# Patient Record
Sex: Female | Born: 1935 | Race: White | Hispanic: No
Health system: Southern US, Community
[De-identification: ages and names within clinical notes are randomized; demographics above are authoritative.]

---

## 2012-03-01 ENCOUNTER — Inpatient Hospital Stay: Payer: Self-pay | Admitting: Orthopedic Surgery

## 2012-03-01 LAB — CBC WITH DIFFERENTIAL/PLATELET
Basophil #: 0.1 10*3/uL (ref 0.0–0.1)
Eosinophil #: 0 10*3/uL (ref 0.0–0.7)
Eosinophil %: 0.1 %
HGB: 13.9 g/dL (ref 12.0–16.0)
Lymphocyte #: 1.1 10*3/uL (ref 1.0–3.6)
MCH: 29.7 pg (ref 26.0–34.0)
MCV: 87 fL (ref 80–100)
Monocyte %: 11.3 %
Neutrophil #: 9.8 10*3/uL — ABNORMAL HIGH (ref 1.4–6.5)
Neutrophil %: 79 %
RBC: 4.68 10*6/uL (ref 3.80–5.20)
WBC: 12.4 10*3/uL — ABNORMAL HIGH (ref 3.6–11.0)

## 2012-03-01 LAB — APTT: Activated PTT: 28.3 secs (ref 23.6–35.9)

## 2012-03-01 LAB — PROTIME-INR: Prothrombin Time: 13.3 secs (ref 11.5–14.7)

## 2012-03-02 LAB — URINALYSIS, COMPLETE
Bacteria: NONE SEEN
Bilirubin,UR: NEGATIVE
Glucose,UR: NEGATIVE mg/dL
Nitrite: NEGATIVE
Ph: 5
Protein: 100
RBC,UR: 4 /HPF
Specific Gravity: 1.031
Squamous Epithelial: 1
WBC UR: 2 /HPF

## 2012-03-02 LAB — BASIC METABOLIC PANEL
Calcium, Total: 8.4 mg/dL — ABNORMAL LOW (ref 8.5–10.1)
Chloride: 104 mmol/L (ref 98–107)
Creatinine: 0.77 mg/dL (ref 0.60–1.30)
EGFR (African American): >60
EGFR (Non-African Amer.): >60
Glucose: 111 mg/dL — ABNORMAL HIGH (ref 65–99)
Potassium: 3.4 mmol/L — ABNORMAL LOW (ref 3.5–5.1)
Sodium: 140 mmol/L (ref 136–145)

## 2012-03-02 LAB — CBC WITH DIFFERENTIAL/PLATELET
Basophil %: 0.1 %
Eosinophil #: 0 10*3/uL (ref 0.0–0.7)
Eosinophil %: 0.1 %
HCT: 39.7 % (ref 35.0–47.0)
Lymphocyte %: 7.1 %
MCH: 30.5 pg (ref 26.0–34.0)
MCHC: 34.6 g/dL (ref 32.0–36.0)
MCV: 88 fL (ref 80–100)
Monocyte %: 10.1 %
Neutrophil #: 10.2 10*3/uL — ABNORMAL HIGH (ref 1.4–6.5)
Neutrophil %: 82.6 %
RBC: 4.51 10*6/uL (ref 3.80–5.20)
RDW: 13.8 % (ref 11.5–14.5)
WBC: 12.4 10*3/uL — ABNORMAL HIGH (ref 3.6–11.0)

## 2012-03-02 LAB — COMPREHENSIVE METABOLIC PANEL WITH GFR
Albumin: 2.9 g/dL — ABNORMAL LOW
Alkaline Phosphatase: 95 U/L
Anion Gap: 11
BUN: 33 mg/dL — ABNORMAL HIGH
Bilirubin,Total: 0.7 mg/dL
Calcium, Total: 8.7 mg/dL
Chloride: 102 mmol/L
Co2: 25 mmol/L
Creatinine: 0.9 mg/dL
EGFR (African American): >60
EGFR (Non-African Amer.): 57 — ABNORMAL LOW
Glucose: 114 mg/dL — ABNORMAL HIGH
Osmolality: 284
Potassium: 3.7 mmol/L
SGOT(AST): 72 U/L — ABNORMAL HIGH
SGPT (ALT): 40 U/L
Sodium: 138 mmol/L
Total Protein: 6.6 g/dL

## 2012-03-02 LAB — CK TOTAL AND CKMB (NOT AT ARMC)
CK, Total: 1347 U/L — ABNORMAL HIGH
CK, Total: 684 U/L — ABNORMAL HIGH (ref 21–215)
CK, Total: 810 U/L — ABNORMAL HIGH (ref 21–215)
CK-MB: 6.2 ng/mL — ABNORMAL HIGH (ref 0.5–3.6)
CK-MB: 6.2 ng/mL — ABNORMAL HIGH (ref 0.5–3.6)
CK-MB: 9.8 ng/mL — ABNORMAL HIGH

## 2012-03-02 LAB — TROPONIN I
Troponin-I: 0.26 ng/mL — ABNORMAL HIGH
Troponin-I: 0.4 ng/mL — ABNORMAL HIGH
Troponin-I: 0.69 ng/mL — ABNORMAL HIGH

## 2012-03-02 LAB — TSH: Thyroid Stimulating Horm: 2.29 u[IU]/mL

## 2012-03-02 LAB — FOLATE: Folic Acid: 12.5 ng/mL (ref 3.1–100.0)

## 2012-03-03 LAB — BASIC METABOLIC PANEL
Calcium, Total: 8 mg/dL — ABNORMAL LOW (ref 8.5–10.1)
Chloride: 107 mmol/L (ref 98–107)
Co2: 23 mmol/L (ref 21–32)
Osmolality: 285 (ref 275–301)
Potassium: 3.5 mmol/L (ref 3.5–5.1)

## 2012-03-03 LAB — CBC WITH DIFFERENTIAL/PLATELET
Basophil #: 0 10*3/uL (ref 0.0–0.1)
Eosinophil %: 0.7 %
HCT: 34.4 % — ABNORMAL LOW (ref 35.0–47.0)
Lymphocyte %: 12.5 %
MCHC: 33.6 g/dL (ref 32.0–36.0)
MCV: 88 fL (ref 80–100)
Monocyte %: 12.1 %
Neutrophil #: 5.9 10*3/uL (ref 1.4–6.5)
RBC: 3.91 10*6/uL (ref 3.80–5.20)
RDW: 13.9 % (ref 11.5–14.5)
WBC: 7.9 10*3/uL (ref 3.6–11.0)

## 2012-03-03 LAB — CK: CK, Total: 747 U/L — ABNORMAL HIGH (ref 21–215)

## 2012-03-04 LAB — CBC WITH DIFFERENTIAL/PLATELET
Basophil #: 0 10*3/uL (ref 0.0–0.1)
Basophil %: 0.4 %
Eosinophil #: 0 10*3/uL (ref 0.0–0.7)
Eosinophil %: 0.1 %
HCT: 31.4 % — ABNORMAL LOW (ref 35.0–47.0)
HGB: 10.6 g/dL — ABNORMAL LOW (ref 12.0–16.0)
Lymphocyte #: 0.4 10*3/uL — ABNORMAL LOW (ref 1.0–3.6)
Lymphocyte %: 3.9 %
MCH: 29.5 pg (ref 26.0–34.0)
MCHC: 33.9 g/dL (ref 32.0–36.0)
Monocyte #: 1.3 x10 3/mm — ABNORMAL HIGH (ref 0.2–0.9)
Neutrophil #: 8 10*3/uL — ABNORMAL HIGH (ref 1.4–6.5)
Neutrophil %: 82.2 %
WBC: 9.7 10*3/uL (ref 3.6–11.0)

## 2012-03-04 LAB — BASIC METABOLIC PANEL
BUN: 16 mg/dL (ref 7–18)
Calcium, Total: 7.7 mg/dL — ABNORMAL LOW (ref 8.5–10.1)
Chloride: 104 mmol/L (ref 98–107)
Co2: 23 mmol/L (ref 21–32)
Creatinine: 0.54 mg/dL — ABNORMAL LOW (ref 0.60–1.30)
EGFR (African American): >60
Glucose: 115 mg/dL — ABNORMAL HIGH (ref 65–99)
Potassium: 3.3 mmol/L — ABNORMAL LOW (ref 3.5–5.1)

## 2012-03-05 LAB — CBC WITH DIFFERENTIAL/PLATELET
Basophil #: 0 10*3/uL (ref 0.0–0.1)
Basophil %: 0.2 %
Eosinophil #: 0 10*3/uL (ref 0.0–0.7)
Eosinophil %: 0.3 %
HCT: 29.4 % — ABNORMAL LOW (ref 35.0–47.0)
HGB: 10.1 g/dL — ABNORMAL LOW (ref 12.0–16.0)
Lymphocyte %: 9 %
MCHC: 34.2 g/dL (ref 32.0–36.0)
Neutrophil #: 8.9 10*3/uL — ABNORMAL HIGH (ref 1.4–6.5)
Neutrophil %: 76.3 %
RBC: 3.37 10*6/uL — ABNORMAL LOW (ref 3.80–5.20)
RDW: 13.5 % (ref 11.5–14.5)
WBC: 11.7 10*3/uL — ABNORMAL HIGH (ref 3.6–11.0)

## 2012-03-05 LAB — URINALYSIS, COMPLETE
Bacteria: NONE SEEN
Bilirubin,UR: NEGATIVE
Glucose,UR: NEGATIVE mg/dL (ref 0–75)
Ketone: NEGATIVE
Ph: 5 (ref 4.5–8.0)
Protein: 100
RBC,UR: 76 /HPF (ref 0–5)
Squamous Epithelial: 1
WBC UR: 71 /HPF (ref 0–5)

## 2012-03-05 LAB — BASIC METABOLIC PANEL
Anion Gap: 9 (ref 7–16)
BUN: 24 mg/dL — ABNORMAL HIGH (ref 7–18)
Calcium, Total: 8 mg/dL — ABNORMAL LOW (ref 8.5–10.1)
Chloride: 106 mmol/L (ref 98–107)
Osmolality: 289 (ref 275–301)
Potassium: 4.2 mmol/L (ref 3.5–5.1)

## 2012-03-05 LAB — MAGNESIUM: Magnesium: 2.1 mg/dL

## 2012-03-06 LAB — CBC WITH DIFFERENTIAL/PLATELET
Basophil #: 0 10*3/uL (ref 0.0–0.1)
Basophil %: 0.3 %
Eosinophil #: 0.1 10*3/uL (ref 0.0–0.7)
Lymphocyte #: 0.8 10*3/uL — ABNORMAL LOW (ref 1.0–3.6)
MCV: 87 fL (ref 80–100)
Monocyte #: 1.2 x10 3/mm — ABNORMAL HIGH (ref 0.2–0.9)
Monocyte %: 10.2 %
Platelet: 319 10*3/uL (ref 150–440)
RDW: 13.8 % (ref 11.5–14.5)
WBC: 12.1 10*3/uL — ABNORMAL HIGH (ref 3.6–11.0)

## 2012-03-06 LAB — BASIC METABOLIC PANEL
Anion Gap: 7 (ref 7–16)
BUN: 20 mg/dL — ABNORMAL HIGH (ref 7–18)
Calcium, Total: 7.8 mg/dL — ABNORMAL LOW (ref 8.5–10.1)
Chloride: 107 mmol/L (ref 98–107)
Creatinine: 0.61 mg/dL (ref 0.60–1.30)
EGFR (Non-African Amer.): >60
Potassium: 3.9 mmol/L (ref 3.5–5.1)

## 2012-03-06 LAB — PATHOLOGY REPORT

## 2012-03-06 LAB — URINE CULTURE

## 2012-03-10 LAB — CULTURE, BLOOD (SINGLE)

## 2012-07-04 DEATH — deceased

## 2014-01-31 IMAGING — CR DG CHEST 1V PORT
1 series · 1 of 1 positions shown · non-contrast
Comparison: none

REASON FOR EXAM: Fever
COMMENTS:

[ap]
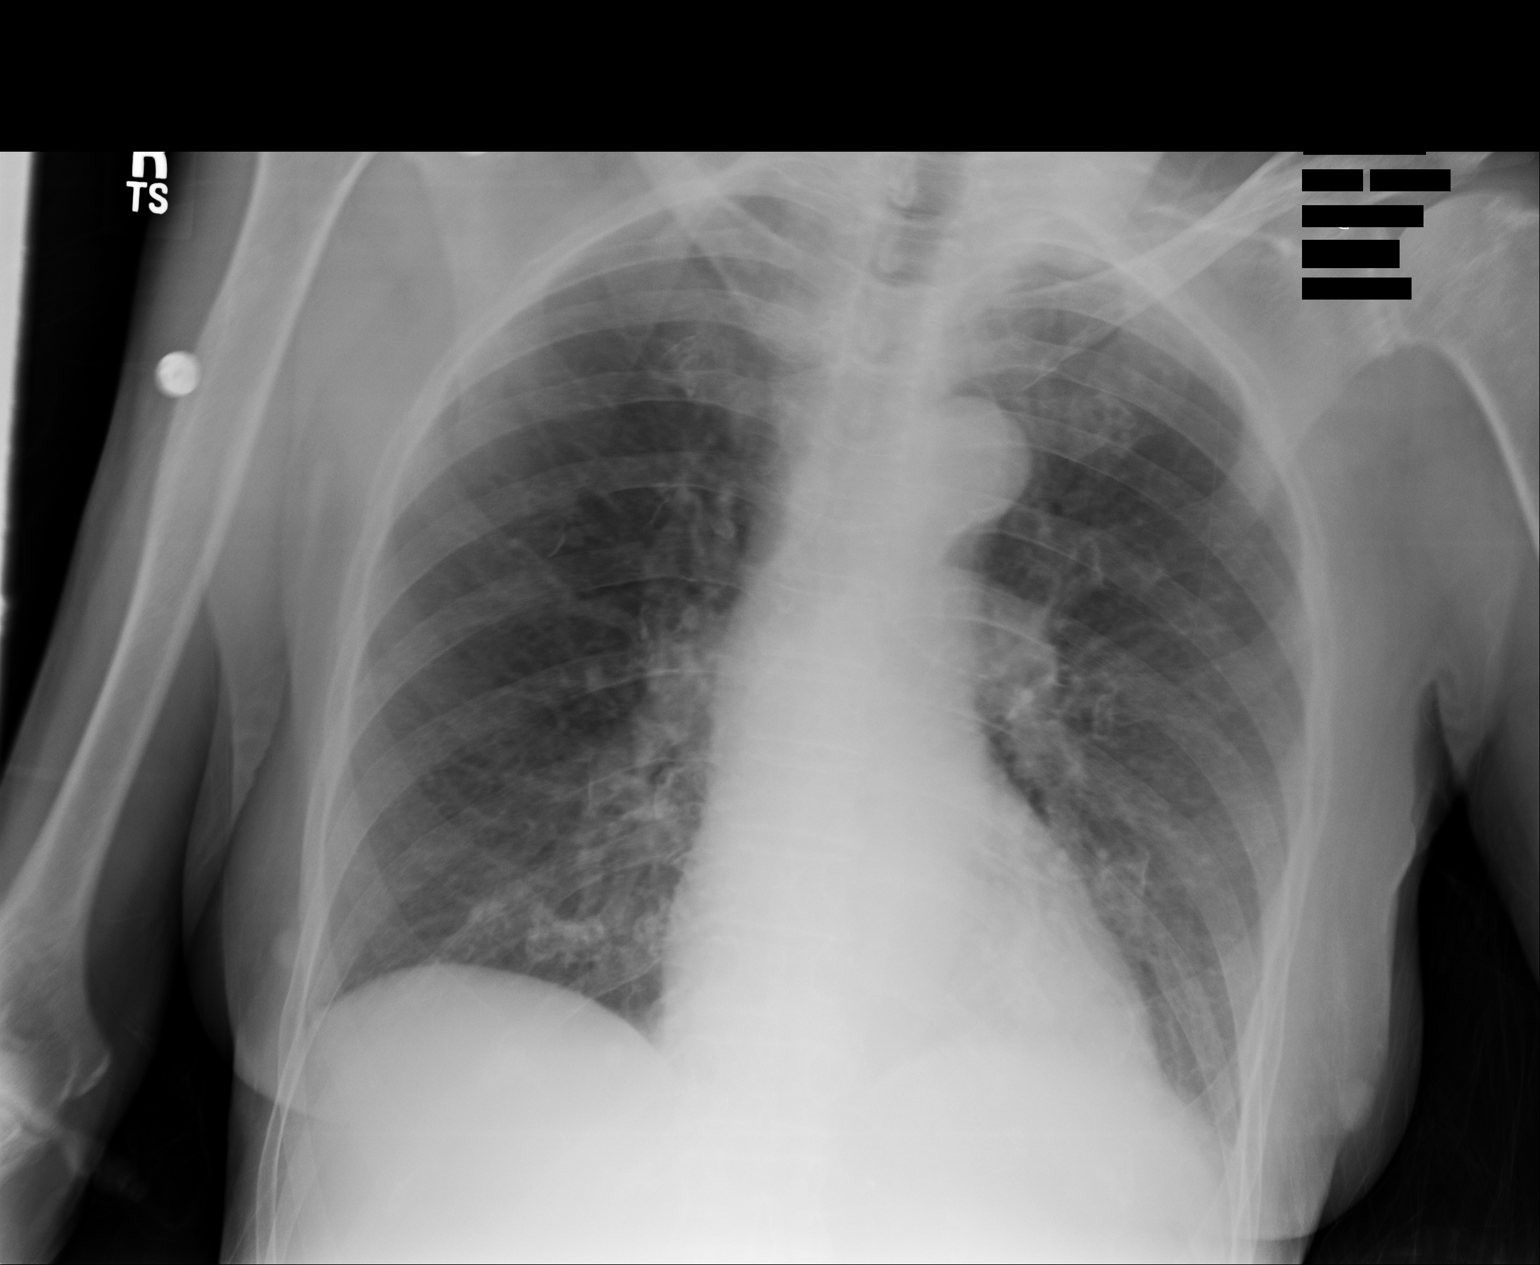

[1 of 1 positions shown; findings below may reference images not displayed]

PROCEDURE:     DXR - DXR PORTABLE CHEST SINGLE VIEW  - March 05, 2012  [DATE]

RESULT:     Comparison is made to the study March 01, 2012.

The lungs are well-expanded. There is no focal infiltrate. The cardiac
silhouette is normal in size. The mediastinum is normal in width. There is
no pleural effusion. The bony thorax exhibits no acute abnormality. There
are coarse lung markings in the retrocardiac region on the left which are
not significantly changed since the previous study.
IMPRESSION: There is no definite evidence of pneumonia nor other acute
cardiopulmonary abnormality.

[REDACTED]

## 2014-09-20 NOTE — Discharge Summary (Signed)
PATIENT NAME:  Michelle Reyes, LEAKS MR#:  [id number] DATE OF BIRTH:  01-04-1921  DATE OF ADMISSION:  03/01/2012 DATE OF DISCHARGE:  03/06/2012  ADMITTING DIAGNOSIS: Left femoral neck hip fracture.  DISCHARGE DIAGNOSES: 1. Status post left hip hemiarthroplasty for femoral neck hip fracture.  2. Elevated troponins likely secondary to rhabdomyolysis.  3. Urinary tract infection.   HISTORY OF PRESENT ILLNESS: Michelle Reyes is an 81 year old female who presented to [facility] on 03/01/2012. The patient fell at home and activated her medical alert. She was found by her guardian and neighbor and was brought to the [facility] Emergency Department where she was diagnosed with a left femoral neck hip fracture. The mechanism of the fall is unknown. The patient suffers from dementia and was unable to provide an accurate history. She was admitted to the orthopedic surgery service for further evaluation and definitive management.   PAST MEDICAL HISTORY: Dementia.   ALLERGIES: No known drug allergies.   HOME MEDICATIONS: Only aspirin.   PRIMARY CARE PHYSICIAN:  The patient has reportedly not seen a physician in years.   HOSPITAL COURSE: The patient presented to the Emergency Room on 03/01/2012. She was admitted to the orthopedic surgery service. She was seen by Dr. Mady Haagensen for preop clearance. The patient was noted to have some elevated troponins and, therefore, a cardiology consult was obtained. Dr. Dorothyann Peng saw and evaluated the patient and cleared her for surgery. He felt that the elevated troponins were due to rhabdomyolysis. The patient was then brought to the Operating Room on 03/03/2012 and underwent an uncomplicated left hip hemiarthroplasty. She tolerated this procedure well. She was then brought back to the orthopedic surgery floor where she remained throughout her hospitalization. She continued to be followed by the hospitalist service as well as the orthopedic  service throughout her hospitalization. The patient had physical and occupational therapy consults called for the first postoperative day. She was given 24 hours of postoperative antibiotics as well as ceftriaxone for a urinary tract infection. The patient had her bandage changed on postoperative day #2 and her Foley catheter was removed. She remained on posterior hip precautions throughout her hospitalization. The patient had difficulty advancing with physical therapy due to dementia and some postoperative delirium. Discontinued use of narcotic medications was instituted as the patient's pain was controlled while on Tylenol. The orthopedic surgeon evaluated the patient's incision daily. She remained neurovascularly intact throughout her hospitalization. Her laboratories including a CBC and BMP were checked daily. The patient had no dramatic drop in her postoperative hematocrit and she did not require blood transfusion during his hospitalization. The patient had stable vital signs throughout her hospital course. Given her clinical improvement, she was prepared for discharge to rehab.   DISCHARGE INSTRUCTIONS:  1. The patient will be discharged with instructions to remain on posterior hip precautions. She is unlikely to be able to comply with partial weight-bearing instructions, therefore weight-bearing as tolerated will be allowed for this patient with her walker.  2. She must continue wearing an abduction pillow whenever she is in bed and must be reminded to avoid crossing her legs, hyperflexing or internally rotating.  3. The patient will continue to wear knee-high TED hose until her follow-up.  4. She will remain on 40 mg of Lovenox subcutaneous daily for deep vein thrombosis prophylaxis. 5. The patient will require physical therapy for lower extremity strengthening and gait training and hip range of motion.  6. Occupational therapy will benefit her for activities of daily  living.  7. The patient will  follow-up to see orthopedic surgeon in two weeks.  8. As recommended, she will follow up with Dr. Juliann Paresallwood to re-evaluate her cardiac status and to possibly address Lopressor doses. The patient was started on Lopressor during this hospitalization. If the patient has or can obtain a primary care physician, she may follow-up with that physician as well.   DISCHARGE MEDICATIONS:  1. Aspirin 81 mg p.o. daily. 2. Lovenox 40 mg subcutaneous daily for a total of four weeks.  3. Calcium carbonate 500 mg plus vitamin D 200 international units p.o. b.i.d.  4. Ferrous sulfate 325 mg p.o. b.i.d. with meals. 5. Lopressor 12.5 mg p.o. b.i.d.    6. Cipro 500 mg p.o. b.i.d. x5 days for her urinary tract infection.   ____________________________ Kathreen DevoidKevin L. Rayman Petrosian, MD klk:ap D: 03/06/2012 13:56:34 ET T: 03/06/2012 14:21:46 ET JOB#: [id number]  cc: Kathreen DevoidKevin L. Tysean Vandervliet, MD, <Dictator> Kathreen DevoidKEVIN L Zeba Luby MD ELECTRONICALLY SIGNED 03/08/2012 22:39

## 2014-09-20 NOTE — Op Note (Signed)
PATIENT NAME:  Michelle PulleyBRODY, Shenea MR#:  [id number] DATE OF BIRTH:  24-Sep-1920  DATE OF PROCEDURE:  03/03/2012  PREOPERATIVE DIAGNOSIS: Left femoral neck hip fracture.   POSTOPERATIVE DIAGNOSIS: Left femoral neck hip fracture.   PROCEDURE: Left hip hemiarthroplasty.   SURGEON: Juanell FairlyKevin Myrella Fahs, M.D.   ANESTHESIA:  Spinal.  COMPLICATIONS: None.   SPECIMENS: Femoral head to pathology.  IMPLANTS: Stryker Accolade HFX stem size #4, Stryker 49-mm unipolar outer diameter head, and a Stryker Unitrax  +4-mm neck.   INDICATIONS FOR SURGERY:  The patient is an 81 year old female who lives at home. She suffers from mild dementia and had an unwitnessed fall. She was found by a neighbor after she had hit her first response alert. The patient was brought to the [facility] Emergency Department where she was diagnosed with a left femoral neck hip fracture. Given the patient's dementia, her neighbor and guardian has her power of attorney. I explained the risks and benefits of surgery to him. He understands the risks include infection requiring the removal of implants, bleeding requiring blood transfusion, nerve or blood vessel injury including the sciatic nerve leading to permanent footdrop and requiring long-term use of an AFO, leg length discrepancy, change in lower extremity rotation, fracture, dislocation, and the need for further surgery. Medical complications include but are not limited to deep vein thrombosis and pulmonary embolism, myocardial infarction, stroke, pneumonia, respiratory failure, and death. The guardian understood these risks and signed for the procedure.   PROCEDURE NOTE: The patient was marked with the word yes over the left hip within the operative field according to the hospital's right site protocol. She was brought to the operating room where she underwent a spinal anesthetic. She was then placed in a right lateral decubitus position. All bony prominences were adequately padded. She had  an axillary roll placed under her right side and she had additional padding to protect the right common peroneal nerve. The patient was positioned using a pegboard. She was prepped and draped in a sterile fashion.   A time-out was performed to verify the patient's name, date of birth, medical record number, correct site of surgery, and correct procedure to be performed. It was also used to verify the patient had received antibiotics and that all appropriate instruments, implants, and radiographic studies were available in the room. Once all in attendance were in agreement, the case began.   A curved incision based over the greater trochanter was drawn out with a surgical marker based upon bony landmarks. A #10 blade was used to make the initial incision. The subcutaneous tissues were dissected using electrocautery. The subcutaneous tissue was removed from the fascia lata. The fascia lata was then sharply incised with a #10 blade. The gluteus maximus was then split in line with its fibers to reveal the underlying hip bursa. The bursa was dissected to reveal the underlying external rotators. The external rotators were removed from their posterior attachment to the greater trochanter with electrocautery and reflected posteriorly to protect the sciatic nerve. A T-shaped capsulotomy was then performed with electrocautery. Both leaflets of the posterior capsule were tagged with a #2 Tycron for later repair. The fracture was then identified. The femoral head was removed with a curved osteotome. The femoral head was sent to pathology.   A Stryker Accolade template was used to mark a femoral osteotomy neck cut. The mark was made with electrocautery. An oscillating saw was then used to perform a proximal femoral osteotomy. The osteotomy was performed approximately 10 mm  above the lesser trochanter.   attention was then turned to the acetabulum.  Two Hohmann retractors were placed around the acetabulum. The femoral  head that had been removed was measured at 49 mm. 48 and 49 and 50-mm trial heads were then placed in the acetabulum. The 49-mm femoral trial had the best fit within the acetabulum.   Attention was then turned back to femoral canal preparation. A box osteotome was used to create the initial entry into the proximal femur. A single hand reamer was placed into the femoral canal and a femoral canal finder was used to confirm the trajectory of the femur before broach placement.   Sequential broaches were then malleted into position in the proximal femur with approximately 10 to 15 degrees of anteversion. These broaches were sequentially enlarged until the best medial and lateral fit was found to be with a size four. The size four broach trial was then the left in place in the femur and a 127-degree neck with zero and +4 head trials were then placed and the hip was reduced and brought through a full range of motion. The +4 was found to have better tension and stability as well as more equivalent leg lengths. All trial instruments were then removed. The hip joint was copiously irrigated with pulse lavage. The actual Stryker TMZF size four femoral stem was then carefully malleted into place. The 49-mm Stryker Unitrax femoral head component was then placed into position with a +4 neck sleeve and gently malleted onto the trunnion. The hip was carefully reduced. Again it was taken through a full range of motion and found to be stable and the leg lengths were equivalent.   The posterior capsule was then closed with interrupted #2 Tycron sutures. The external rotators were sewn into the soft tissue near the adductor attachment to the greater trochanter. Again, the wound was copiously irrigated. The fascia lata was then closed with interrupted 0 Vicryl and final pulse lavage of the wound was performed. The subcutaneous tissue was closed in two layers, the deep layer with a 0 Vicryl and the superficial layer with a 2-0  Vicryl. The skin was approximated with skin staples. A dry sterile dressing was applied. The patient was then carefully positioned supine on the operative table. Her leg lengths were then measured to be equivalent. An abduction pillow was placed between her knees and then she was transferred to a hospital bed and brought to the PAC-U in stable condition. I was scrubbed and present for the entire case and all sharp and instrument counts were correct at the conclusion of the case.     ____________________________ Kathreen Devoid, MD klk:bjt D: 03/08/2012 23:09:14 ET T: 03/09/2012 06:39:25 ET JOB#: [id number]  cc: Kathreen Devoid, MD, <Dictator> Kathreen Devoid MD ELECTRONICALLY SIGNED 03/09/2012 17:22

## 2014-09-20 NOTE — H&P (Signed)
Subjective/Chief Complaint Left hip injury    History of Present Illness 81 y/o female with dementia fell at home yesterday and activated medical alert.  She was found on the floor by a neighbor and brought to [facility] where she was diagnosed with a left femoral neck hip fracture.  Mechanism of the fall is unknown.  No reported additional injuries.  Patient is not able to provide an accurate history.  Her friend and neighbor, Octavia Bruckner, is at the bedside.   He is her guardian.  Patient has not been to a physician is a long time.   Past Med/Surgical Hx:  Dementia:   ALLERGIES:  No Known Allergies:   HOME MEDICATIONS: Medication Instructions Status  aspirin   once a day, dosage unknown Active   Family and Social History:   Family History Non-Contributory    Place of Living Home   Review of Systems:   Subjective/Chief Complaint Left hip pain   Physical Exam:   GEN no acute distress    HEENT PERRL, hearing intact to voice, dry oral mucosa, Oropharynx clear    NECK supple  No masses  trachea midline    RESP normal resp effort  clear BS  no use of accessory muscles    CARD regular rate  no murmur  No LE edema  no JVD    ABD denies tenderness  soft  normal BS    GU foley catheter in place    EXTR Left lower extremity in Buck's traction.  There is shortening and external rotation.  Pedal pulses are palpable.  The skin is intact over the left hip with no erythema or ecchymosis.    SKIN normal to palpation    NEURO motor/sensory function intact    PSYCH A+O to time, place, person   Lab Results: Routine BB:  30-Sep-13 00:14    Crossmatch Unit 1 Ready   Crossmatch Unit 2 Ready (Result(s) reported on 02 Mar 2012 at 03:48AM.)   ABO Group + Rh Type A Positive   Antibody Screen NEGATIVE (Result(s) reported on 02 Mar 2012 at 03:33AM.)  Routine Chem:  11-MYT-11 73:56    Folic Acid, Serum 70.1 (Result(s) reported on 02 Mar 2012 at 03:58AM.)   Glucose, Serum  111   BUN  29    Creatinine (comp) 0.77   Sodium, Serum 140   Potassium, Serum  3.4   Chloride, Serum 104   CO2, Serum 26   Calcium (Total), Serum  8.4   Anion Gap 10   Osmolality (calc) 286   eGFR (African American) >60   eGFR (Non-African American) >60 (eGFR values <46m/min/1.73 m2 may be an indication of chronic kidney disease (CKD). Calculated eGFR is useful in patients with stable renal function. The eGFR calculation will not be reliable in acutely ill patients when serum creatinine is changing rapidly. It is not useful in  patients on dialysis. The eGFR calculation may not be applicable to patients at the low and high extremes of body sizes, pregnant women, and vegetarians.)    07:30    Result Comment TROPONIN - RESULTS VERIFIED BY REPEAT TESTING.  - CALLED TO BRANDY ROBBINS [place] [id number] - BY GA ON 03/02/12  - READ-BACK PROCESS PERFORMED.  Result(s) reported on 02 Mar 2012 at 08:41AM.  Cardiac:  30-Sep-13 07:30    CK, Total  810   CPK-MB, Serum  6.2 (Result(s) reported on 02 Mar 2012 at 08:26AM.)   Troponin I  0.40 (0.00-0.05 0.05 ng/mL or less:  NEGATIVE  Repeat testing in 3-6 hrs  if clinically indicated. >0.05 ng/mL: POTENTIAL  MYOCARDIAL INJURY. Repeat  testing in 3-6 hrs if  clinically indicated. NOTE: An increase or decrease  of 30% or more on serial  testing suggests a  clinically important change)  Routine Hem:  30-Sep-13 02:23    WBC (CBC)  12.4   RBC (CBC) 4.51   Hemoglobin (CBC) 13.7   Hematocrit (CBC) 39.7   Platelet Count (CBC) 302   MCV 88   MCH 30.5   MCHC 34.6   RDW 13.8   Neutrophil % 82.6   Lymphocyte % 7.1   Monocyte % 10.1   Eosinophil % 0.1   Basophil % 0.1   Neutrophil #  10.2   Lymphocyte #  0.9   Monocyte #  1.2   Eosinophil # 0.0   Basophil # 0.0 (Result(s) reported on 02 Mar 2012 at 03:30AM.)     Assessment/Admission Diagnosis Left femoral neck hip fracture    Plan I explained to Tim, the diagnosis and have recommended a hemiarthoplasty as  treatment.   The risks and benefits of surgical intervention were discussed in detail with him and he expressed understanding of the risks and benefits and agreed with plans for surgery. The risks include, but are not limited to: infection, bleeding requiring transfusion, nerve and blood vessel injury (especially the sciatic nerve leading to foot drop), dislocation, fracture, leg length discrepancy, change in lower leg rotation, persistent left hip pain and weakness, failure to return to ambulation,  need for more surgery including conversion to a total hip arthroplasty, DVT, and PE, MI, stroke, pneumonia, respiratory failure and death.  Patient has been seen by the hospitalist and Dr. Clayborn Bigness from cardiology.  She is cleared for surgery.  Patient will be NPO after midnight for surgery tomorrow afternoon.   Electronic Signatures: Thornton Park (MD)  (Signed 30-Sep-13 12:45)  Authored: CHIEF COMPLAINT and HISTORY, PAST MEDICAL/SURGIAL HISTORY, ALLERGIES, HOME MEDICATIONS, FAMILY AND SOCIAL HISTORY, REVIEW OF SYSTEMS, PHYSICAL EXAM, LABS, ASSESSMENT AND PLAN   Last Updated: 30-Sep-13 12:45 by Thornton Park (MD)

## 2014-09-20 NOTE — Consult Note (Signed)
Brief Consult Note: Diagnosis: Abn Ekg/Elevated enzymes/Pre-op  Hip.   Patient was seen by consultant.   Consult note dictated.   Recommend to proceed with surgery or procedure.   Orders entered.   Discussed with Attending MD.   Comments: IMP Abn Ekg Elevated Enzymes Dementia Fx Hip Pre-op Hip Hx polio Falls Rhabdo . PLAN Hydration for Rhabdo Consider low dose Bicarb to protect kidneys Consider low dose B-blockers pre and post op Doubt MI, probably all rhabdo I do not rec further cardiac w/u at this point She is an acceptable surgical risk for none cardiac surgery Social service input for long placement PT/OT DVT prophylaxis I will follow with you.  Electronic Signatures: Dorothyann Pengallwood, Paxton Binns D (MD)  (Signed 30-Sep-13 09:24)  Authored: Brief Consult Note   Last Updated: 30-Sep-13 09:24 by Alwyn Peaallwood, Glendine Swetz D (MD)

## 2014-09-20 NOTE — Consult Note (Signed)
Referring Physician:  Thornton Park :   Primary Care Physician:  Thornton Park : [facility], [address], [place], [place], [place] [address], [place] [phone]  Michelle Reyes :   Reason for Consult:  Admit Date: 02-Mar-2012   Reason for Consult: altered mental status   History of Present Illness:  History of Present Illness:   81 year old woman with a 6-7 year history of dementia had a fall in her home resulting in a left hip fracture.  In addition, the patient has a known sensory polyneuropathy.  She was by herself but has caretakers who stop by on a daily basis to check on her.  She has gotten gradually worse over the past couple of months.  I've spoken with her primary caretaker named Michelle Reyes who is also her power of Attorney.  He states that she is currently very close to her normal self, if not completely normal.  She typically refuses all medication.  She was hospitalized on March 02, 2012 and a left hip fracture was repaired yesterday.  Not much else is known as the patient has a moderate dementia and cannot communicate pertinent aspects of her history. PSH, FH, SH, and ROS cannot be performed due to moderate to severe dementia. demented.  Wearing restraint mittens.  Normocephalic and atraumatic. exam without pharmacologic dilation shows normal disc size, appearance and C/Reyes ratio.  There is no papilledema. and S2 sounds are within normal limits, without murmurs, gallops, or rubs. - Thin- NormalDrift - Absent bilaterally.- Gait and station cannot be checked due to the hip fracture.    Shoulder abduction (deltoid/supraspinatus, axillary/suprascapular n, C5)   Elbow flexion (biceps brachii, musculoskeletal n, C5-6)   Elbow extension (triceps, radial n, C7)   Finger adduction (interossei, ulnar n, T1)    Hip flexion (iliopsoas, L1/L2)   Knee flexion (hamstrings, sciatic n, L5/S1)    Knee extension (quadriceps, femoral n, L3/4)   Ankle dorsiflexion  (tibialis anterior, deep fibular n, L4/5)   Ankle plantarflexion (gastroc, tibial n, S1)  STATUS.is oriented to person only.  Recent and remote memory are both moderately to severely impaired.  Attention span and concentration are moderately to severely impaired.  Naming, repetition, comprehension and expressive speech are generally within normal limits.  Patient's fund of knowledge cannot be accurately assessed due to her moderate to severe dementia. NERVES.   CN II (normal visual acuity and visual fields)   CN III, IV, VI (extraocular muscles are intact)   CN V (facial sensation is intact bilaterally)   CN VII (facial strength is intact bilaterally)   CN VIII (hearing is intact bilaterally)   CN IX/X (palate elevates midline, normal phonation)   CN XI (shoulder shrug strength is normal and symmetric)   CN XII (tongue protrudes midline) intact to pain and temp bilaterally (spinothalamic tracts)intact to position and vibration bilaterally (dorsal columns)    Biceps   Brachioradialis    Patellar   Achilles to nose testing is within normal limits bilaterally. HEAD WITHOUT CONTRAST  - Mar 01 2012 11:23PM  Comparison:  None Multiple axial images from the foramen magnum to the vertex obtained without IV contrast.   is no evidence of mass effect, midline shift, or extra-axial fluid  There is no evidence of a space-occupying lesion or hemorrhage. There is no evidence of a cortical-based area of  infarction. There is generalized cerebral atrophy. There is white matter low attenuation likely secondary to  is ventriculomegaly. The basal cisterns are  patent. portions of the orbits are unremarkable. The visualized of the paranasal sinuses and mastoid air cells are unremarkable.  osseous structures are unremarkable.   acute intracranial process. which may be secondary to cerebral atrophy, but a appearance can be seen with normal pressure hydrocephalus. with clinical exam.  woman with a 6-7 year history of  dementia had a fall in her home resulting in a left hip fracture.  Neurology is asked to evaluate because of altered mental status and dementia.   mental status.  It appears that the patient is essentially back to baseline.  At the time of admission, because of her altered mental status is most likely multifactorial.  Most likely can was a significant component.  There is no reason at this point is to suspect seizure.  Her head CT was negative for any acute changes.  She is not having focal neurological deficits to suggest stroke or other CNS process.  The patient has long-standing dementia.  I personally reviewed her head CT which shows ventriculomegaly in proportion to her severe general cerebral atrophy.  I do not see any reason to think that the scan suggests normal pressure hydrocephalus.  Her clinical history does not support the diagnosis of normal pressure hydrocephalus and the gradual onset of symptoms over many years supports a more common degenerative process such as Alzheimer's or vascular dementia.  In any case, the window of opportunity for treatment of normal pressure hydrocephalus is usually 1-2 years after onset of symptoms for a good outcome.  With her symptoms going on for 6-7 years, it would be very unlikely that she would benefit from surgical shunt even if the clinical history was more convincing.  In addition, she has a number of risk factors that make her a poor surgical candidate.  As such, I would not recommend diagnostic spinal tap.  I have been informed by her guardian that she refuses all medications.  If would be amenable to a patch, then an Exelon patch could be considered.   Michelle Nakayama, MD    Past Medical/Surgical Hx:  Dementia:   Home Medications: Medication Instructions Last Modified Date/Time  aspirin   once a day, dosage unknown 30-Sep-13 09:45   Allergies:  No Known Allergies:   Vital Signs: **Vital Signs.:   03-Oct-13 13:59   Vital Signs Type Routine    Temperature Temperature (F) 97.3   Celsius 36.2   Temperature Source Oral   Pulse Pulse 92   Respirations Respirations 18   Systolic BP Systolic BP 388   Diastolic BP (mmHg) Diastolic BP (mmHg) 59   Mean BP 78   Pulse Ox % Pulse Ox % 97   Pulse Ox Activity Level  At rest   Oxygen Delivery Room Air/ 21 %   Lab Results: Thyroid:  29-Sep-13 23:39    Thyroid Stimulating Hormone 2.29 (0.45-4.50 (International Unit)  ----------------------- Pregnant patients have  different reference  ranges for TSH:  - - - - - - - - - -  Pregnant, first trimetser:  0.36 - 2.50 uIU/mL)  Hepatic:  29-Sep-13 23:39    Bilirubin, Total 0.7   Alkaline Phosphatase 95   SGPT (ALT) 40   SGOT (AST)  72   Total Protein, Serum 6.6   Albumin, Serum  2.9  Routine BB:  30-Sep-13 00:14    Crossmatch Unit 1 Ready   Crossmatch Unit 2 Ready (Result(s) reported on 02 Mar 2012 at 03:48AM.)   ABO Group + Rh Type A Positive   Antibody  Screen NEGATIVE (Result(s) reported on 02 Mar 2012 at 03:33AM.)  Routine Chem:  [id number] 38:75    Folic Acid, Serum 64.3 (Result(s) reported on 02 Mar 2012 at 03:58AM.)    14:57    Result Comment troponin - RESULTS VERIFIED BY REPEAT TESTING.  - prev. called 9/30/13at [id number] [email or web address]  Result(s) reported on 02 Mar 2012 at 03:54PM.  03-Oct-13 00:47    Glucose, Serum  133   BUN  24   Creatinine (comp) 0.87   Sodium, Serum 142   Potassium, Serum 4.2   Chloride, Serum 106   CO2, Serum 27   Calcium (Total), Serum  8.0   Anion Gap 9   Osmolality (calc) 289   eGFR (African American) >60   eGFR (Non-African American)  59 (eGFR values <76m/min/1.73 m2 may be an indication of chronic kidney disease (CKD). Calculated eGFR is useful in patients with stable renal function. The eGFR calculation will not be reliable in acutely ill patients when serum creatinine is changing rapidly. It is not useful in  patients on dialysis. The eGFR calculation may not be applicable to patients at  the low and high extremes of body sizes, pregnant women, and vegetarians.)   Magnesium, Serum 2.1 (1.8-2.4 THERAPEUTIC RANGE: 4-7 mg/dL TOXIC: > 10 mg/dL  -----------------------)  Cardiac:  30-Sep-13 14:57    CPK-MB, Serum  6.2 (Result(s) reported on 02 Mar 2012 at 03:47PM.)   Troponin I  0.26 (0.00-0.05 0.05 ng/mL or less: NEGATIVE  Repeat testing in 3-6 hrs  if clinically indicated. >0.05 ng/mL: POTENTIAL  MYOCARDIAL INJURY. Repeat  testing in 3-6 hrs if  clinically indicated. NOTE: An increase or decrease  of 30% or more on serial  testing suggests a  clinically important change)  03-Oct-13 00:47    CK, Total  239 (Result(s) reported on 05 Mar 2012 at 01:25AM.)  Routine UA:  03-Oct-13 00:26    Color (UA) Amber   Clarity (UA) Cloudy   Glucose (UA) Negative   Bilirubin (UA) Negative   Ketones (UA) Negative   Specific Gravity (UA) 1.041   Blood (UA) 2+   pH (UA) 5.0   Protein (UA) 100 mg/dL   Nitrite (UA) Negative   Leukocyte Esterase (UA) 1+ (Result(s) reported on 05 Mar 2012 at 12:58AM.)   RBC (UA) 76 /HPF   WBC (UA) 71 /HPF   Bacteria (UA) NONE SEEN   Epithelial Cells (UA) 1 /HPF   Mucous (UA) PRESENT   Hyaline Cast (UA) 5 /LPF (Result(s) reported on 05 Mar 2012 at 12:58AM.)  Routine Coag:  29-Sep-13 23:39    Prothrombin 13.3   INR 1.0 (INR reference interval applies to patients on anticoagulant therapy. A single INR therapeutic range for coumarins is not optimal for all indications; however, the suggested range for most indications is 2.0 - 3.0. Exceptions to the INR Reference Range may include: Prosthetic heart valves, acute myocardial infarction, prevention of myocardial infarction, and combinations of aspirin and anticoagulant. The need for a higher or lower target INR must be assessed individually. Reference: The Pharmacology and Management of the Vitamin K  antagonists: the seventh ACCP Conference on Antithrombotic and Thrombolytic Therapy. CACZYS.0630 Sept:126 (3suppl): [id number] A HCT value >55% may artifactually increase the PT.  In one study,  the increase was an average of 25%. Reference:  "Effect on Routine and Special Coagulation Testing Values of Citrate Anticoagulant Adjustment in Patients with High HCT Values." American Journal of Clinical Pathology 2006;126:400-405.)   Activated PTT (APTT) 28.3 (A HCT  value >55% may artifactually increase the APTT. In one study, the increase was an average of 19%. Reference: "Effect on Routine and Special Coagulation Testing Values of Citrate Anticoagulant Adjustment in Patients with High HCT Values." American Journal of Clinical Pathology 2006;126:400-405.)  Routine Hem:  03-Oct-13 00:47    WBC (CBC)  11.7   RBC (CBC)  3.37   Hemoglobin (CBC)  10.1   Hematocrit (CBC)  29.4   Platelet Count (CBC) 292   MCV 88   MCH 29.9   MCHC 34.2   RDW 13.5   Neutrophil % 76.3   Lymphocyte % 9.0   Monocyte % 14.2   Eosinophil % 0.3   Basophil % 0.2   Neutrophil #  8.9   Lymphocyte # 1.0   Monocyte #  1.7   Eosinophil # 0.0   Basophil # 0.0 (Result(s) reported on 05 Mar 2012 at 01:09AM.)   Radiology Results: CT:    29-Sep-13 23:23, CT Head Without Contrast   CT Head Without Contrast    REASON FOR EXAM:    s/p fall  COMMENTS:       PROCEDURE: CT  - CT HEAD WITHOUT CONTRAST  - Mar 01 2012 11:23PM     RESULT: Comparison:  None    Technique: Multiple axial images from the foramen magnum to the vertex   were obtained without IV contrast.    Findings:      There is no evidence of mass effect, midline shift, or extra-axial fluid   collections.  There is no evidence of a space-occupying lesion or   intracranial hemorrhage. There is no evidence of a cortical-based area of     acute infarction. There is generalized cerebral atrophy. There is   periventricular white matter low attenuation likely secondary to   microangiopathy.    There is ventriculomegaly. The basal cisterns are  patent.    Visualized portions of the orbits are unremarkable. The visualized   portions of the paranasal sinuses and mastoid air cells are unremarkable.     The osseous structures are unremarkable.    IMPRESSION:      No acute intracranial process.    Ventriculomegaly which may be secondary to cerebral atrophy, but a   similar appearance can be seen with normal pressure hydrocephalus.   Correlate with clinical exam.    Dictation Site: 1          Verified By: Jennette Banker, M.Reyes., MD   Electronic Signatures: Anabel Bene (MD)  (Signed 03-Oct-13 14:47)  Authored: REFERRING PHYSICIAN, Primary Care Physician, Consult, History of Present Illness, PAST MEDICAL/SURGICAL HISTORY, HOME MEDICATIONS, ALLERGIES, NURSING VITAL SIGNS, LAB RESULTS, RADIOLOGY RESULTS   Last Updated: 03-Oct-13 14:47 by Anabel Bene (MD)

## 2014-09-20 NOTE — Consult Note (Signed)
PATIENT NAME:  Michelle Reyes, Michelle Reyes MR#:  [id number] DATE OF BIRTH:  25-May-1921  DATE OF CONSULTATION:  03/02/2012  REFERRING PHYSICIAN:  Dr. Juanell Fairly  CONSULTING PHYSICIAN:  Lennox Pippins, MD  REASON FOR CONSULTATION: Preoperative evaluation for left hip fracture, altered mental status.   HISTORY OF PRESENT ILLNESS: The patient is an 81 year old Caucasian female with past medical history of peripheral neuropathy, dementia followed by Dr. Cristopher Peru, history of left radial head fracture who presented to [facility] status post fall today. The patient unfortunately is a very poor historian and currently has altered mental status and is unable to offer any history regarding her present illness. History is obtained by talking with the Emergency Room physician as well as talking with the patient's healthcare power of attorney, Wendi Maya. Apparently the patient fell at home today. It is unclear as to what time she fell exactly. The patient's healthcare power of attorney states that he saw the patient at approximately 8:00 a.m. this morning. He states that he then went to church. He was subsequently alerted by medical alert company that she had fallen. Patient was found on the floor at approximately 3:00 p.m. by her healthcare power of attorney. She was then brought here and found to have a left femoral neck fracture. In regards to her recent history, it appears that she has been having increasing confusion over the past two weeks. She has been defecating and urinating on the floor over this same period of time. It appears that she has attempted to maintain her p.o. intake as the healthcare power of attorney does ensure that she has adequate amounts of food at home. Patient also has an aide who comes to her home three days a week. Patient's healthcare power of attorney has suggested to her that she consider placement, however, she has resisted in the past. She has been apparently seeing Dr.  Sherryll Burger for underlying peripheral neuropathy and dementia with [facility] neurology. CT scan of the head was performed which showed cerebral atrophy with ventriculomegaly. However, there was a possibility of normal pressure hydrocephalus mention on differential diagnosis as well.   PAST MEDICAL HISTORY:  1. Peripheral neuropathy.  2. Dementia followed by Dr. Cristopher Peru.   3. History of left radial head fracture.   ALLERGIES: No known drug allergies.   HOME MEDICATIONS: None listed.   SOCIAL HISTORY: Patient lives in [place]. She lives alone. She has a healthcare power of attorney whose name is Wendi Maya. His cell phone three is [phone] and his home number is [phone]. He reports that there is no tobacco, alcohol, or illicit drug use.   FAMILY HISTORY: Provided by patient's healthcare power of attorney. Patient's mother died when the patient was [age], apparently secondary to breast cancer. The patient's father died in his 42s, however, it is unclear as to what caused his death.   REVIEW OF SYSTEMS: Currently unobtainable from the patient as she is confused and unable to offer accurate details regarding review of systems.   PHYSICAL EXAMINATION:  VITAL SIGNS: Temperature 97.7, pulse 90, respirations 18, blood pressure 127/69, pulse oximetry 97% on room air.   GENERAL: Disheveled Caucasian female who appears her stated age, currently no acute distress.   HEENT: Normocephalic, atraumatic. Extraocular movements are intact. Pupils are equal, round, reactive to light. No scleral icterus. Conjunctivae are pink. No epistaxis noted. Gross hearing appears to be intact. Oral mucosa are quite dry.   NECK: Supple without JVD, lymphadenopathy.  LUNGS: Clear to auscultation bilaterally with normal respiratory effort.   CARDIOVASCULAR: S1, S2 regular rate and rhythm. No murmurs or rubs appreciated.   ABDOMEN: Soft, nontender, nondistended. Bowel sounds positive. No rebound or  guarding. No gross organomegaly appreciated.   EXTREMITIES: No clubbing or cyanosis noted. No lower extremity edema.   MUSCULOSKELETAL: The patient's left leg is externally rotated consistent with left hip fracture.   SKIN: Warm and dry. Patient has a number of healing abrasions on bilateral upper extremities.   NEUROLOGICAL: Patient is awake and alert but completely disoriented. She currently believes that she is in [place]. She does follow commands with bilateral upper extremities. Her left lower extremity is externally rotated.   PSYCHIATRIC: Patient is awake and alert. She has poor insight into her current illness. It appears at times that she is also confabulating.   LABORATORY, DIAGNOSTIC AND RADIOLOGICAL DATA: Sodium 138, potassium 3.7, chloride 102, CO2 25, BUN 33, creatinine 0.9, glucose 114, total protein 6.6, albumin 2.9, total bilirubin 0.7, alkaline phosphatase 95, AST 72, ALT 40, CK 1347 CK-MB 9.8. Troponin 0.69. CBC shows WBC 12.4, hemoglobin 13.9, hematocrit 40.9, platelets 297, INR 1.0. EKG shows ST abnormality in V4 and V5. There appears to be some T wave flattening in leads 2, 3 and aVF. EKG reads inferior infarct, age undetermined.   IMPRESSION: The patient is an 81 year old Caucasian female with past medical history of peripheral neuropathy, dementia followed by Dr. Cristopher Peru, history of left radial head fracture who presented to [facility] status post fall and found to have left hip fracture as well as altered mental status.   PROBLEM LIST:  1. Left hip fracture.  2. Altered mental status with question of cerebral atrophy with ventriculomegaly versus normal pressure hydrocephalus.  3. History of underlying dementia.  4. History of peripheral neuropathy.  5. Rhabdomyolysis.  6. Elevated troponin of 0.69 with EKG abnormality.  7. Malnutrition with albumin of 2.9.   PLAN: The patient presents with obvious left hip fracture at this point in time. It  appears that she has had declining health at home recently and has been found to be defecating and urinating on the floor. She will likely need to have her left hip fracture reduced. At this point in time she likely has at least a moderate surgical risk. She does appear to have some EKG abnormalities and also has mildly elevated troponin of 0.69. We will continue to trend CK-MB as well as troponin. We will also obtain cardiology consultation for cardiac preoperative evaluation prior to surgery. She also appears to have rhabdomyolysis. She has been started on IV fluid hydration. We will start the patient on 0.9 normal saline at 125 mL/h. In addition, the patient has history of dementia. However, she has had worsening mental status over the past two weeks. CT scan was performed and shows cerebral atrophy with ventriculomegaly, however, normal pressure hydrocephalus is also in the differential as the patient has been having relatively frequent falls at home as well as urinary incontinence. We will consult Dr. Cristopher Peru to evaluate for this underlying possibility. We will continue to follow along during the patient's hospitalization. She will likely require placement as there has been some concern by the healthcare power of attorney as to whether the patient can take care of herself at home.   TIME SPENT: One hour in obtaining history from ED physician as well as patient's healthcare power of attorney, Wendi Maya.   ____________________________ Lennox Pippins, MD mnl:cms  D: 03/02/2012 01:08:47 ET T: 03/02/2012 09:17:48 ET JOB#: [id number]  cc: Lennox PippinsMunsoor N. Floride Hutmacher, MD, <Dictator> Ria CommentMUNSOOR N Yolanda Huffstetler MD ELECTRONICALLY SIGNED 03/10/2012 23:30
